# Patient Record
Sex: Female | Born: 1990 | Race: White | Hispanic: No | Marital: Single | Smoking: Never smoker
Health system: Southern US, Community
[De-identification: ages and names within clinical notes are randomized; demographics above are authoritative.]

## PROBLEM LIST (undated history)

## (undated) HISTORY — PX: WRIST SURGERY: SHX841

---

## 2021-01-06 ENCOUNTER — Other Ambulatory Visit: Payer: Self-pay

## 2021-01-06 ENCOUNTER — Emergency Department (HOSPITAL_COMMUNITY): Payer: Medicaid Other

## 2021-01-06 ENCOUNTER — Encounter (HOSPITAL_COMMUNITY): Payer: Self-pay | Admitting: Emergency Medicine

## 2021-01-06 ENCOUNTER — Emergency Department (HOSPITAL_COMMUNITY)
Admission: EM | Admit: 2021-01-06 | Discharge: 2021-01-06 | Disposition: A | Discharge status: Home / Self Care | Payer: Medicaid Other | Attending: Emergency Medicine | Admitting: Emergency Medicine

## 2021-01-06 DIAGNOSIS — R0989 Other specified symptoms and signs involving the circulatory and respiratory systems: Secondary | ICD-10-CM | POA: Diagnosis present

## 2021-01-06 DIAGNOSIS — S0990XA Unspecified injury of head, initial encounter: Secondary | ICD-10-CM | POA: Diagnosis not present

## 2021-01-06 DIAGNOSIS — R599 Enlarged lymph nodes, unspecified: Secondary | ICD-10-CM | POA: Diagnosis not present

## 2021-01-06 DIAGNOSIS — J029 Acute pharyngitis, unspecified: Secondary | ICD-10-CM | POA: Diagnosis not present

## 2021-01-06 LAB — CBC WITH DIFFERENTIAL/PLATELET
Abs Immature Granulocytes: 0.04 10*3/uL (ref 0.00–0.07)
Basophils Absolute: 0 10*3/uL (ref 0.0–0.1)
Basophils Relative: 0 %
Eosinophils Absolute: 0.1 10*3/uL (ref 0.0–0.5)
Eosinophils Relative: 1 %
HCT: 37.8 % (ref 36.0–46.0)
Hemoglobin: 12.5 g/dL (ref 12.0–15.0)
Immature Granulocytes: 0 %
Lymphocytes Relative: 16 %
Lymphs Abs: 1.8 10*3/uL (ref 0.7–4.0)
MCH: 26.8 pg (ref 26.0–34.0)
MCHC: 33.1 g/dL (ref 30.0–36.0)
MCV: 80.9 fL (ref 80.0–100.0)
Monocytes Absolute: 0.7 10*3/uL (ref 0.1–1.0)
Monocytes Relative: 7 %
Neutro Abs: 8.4 10*3/uL — ABNORMAL HIGH (ref 1.7–7.7)
Neutrophils Relative %: 76 %
Platelets: 231 10*3/uL (ref 150–400)
RBC: 4.67 MIL/uL (ref 3.87–5.11)
RDW: 13 % (ref 11.5–15.5)
WBC: 11.1 10*3/uL — ABNORMAL HIGH (ref 4.0–10.5)
nRBC: 0 % (ref 0.0–0.2)

## 2021-01-06 LAB — I-STAT CHEM 8, ED
BUN: 5 mg/dL — ABNORMAL LOW (ref 6–20)
Calcium, Ion: 1.19 mmol/L (ref 1.15–1.40)
Chloride: 102 mmol/L (ref 98–111)
Creatinine, Ser: 0.7 mg/dL (ref 0.44–1.00)
Glucose, Bld: 108 mg/dL — ABNORMAL HIGH (ref 70–99)
HCT: 37 % (ref 36.0–46.0)
Hemoglobin: 12.6 g/dL (ref 12.0–15.0)
Potassium: 3.2 mmol/L — ABNORMAL LOW (ref 3.5–5.1)
Sodium: 141 mmol/L (ref 135–145)
TCO2: 26 mmol/L (ref 22–32)

## 2021-01-06 LAB — I-STAT BETA HCG BLOOD, ED (MC, WL, AP ONLY): I-stat hCG, quantitative: <5 m[IU]/mL (ref <5)

## 2021-01-06 MED ORDER — IOHEXOL 350 MG/ML SOLN
75.0000 mL | Freq: Once | INTRAVENOUS | Status: AC | PRN
  Administered 2021-01-06: 75 mL via INTRAVENOUS

## 2021-01-06 MED ORDER — LIDOCAINE VISCOUS HCL 2 % MT SOLN: 15.0000 mL | OROMUCOSAL | 0 refills | Status: AC | PRN

## 2021-01-06 MED ORDER — CLINDAMYCIN HCL 150 MG PO CAPS: 300.0000 mg | ORAL_CAPSULE | Freq: Three times a day (TID) | ORAL | 0 refills | Status: AC

## 2021-01-06 MED ORDER — LIDOCAINE VISCOUS HCL 2 % MT SOLN
15.0000 mL | Freq: Once | OROMUCOSAL | Status: AC
  Administered 2021-01-06: 15 mL via OROMUCOSAL
  Filled 2021-01-06: qty 15

## 2021-01-06 MED ORDER — CLINDAMYCIN HCL 150 MG PO CAPS
300.0000 mg | ORAL_CAPSULE | Freq: Once | ORAL | Status: AC
  Administered 2021-01-06: 300 mg via ORAL
  Filled 2021-01-06: qty 2
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
## 2021-01-06 NOTE — Discharge Instructions (Signed)
As discussed, today's evaluation has been generally reassuring.  However, with some mild lab abnormalities, it is important that you take your medication as prescribed and follow-up with your physician for additional evaluation as needed.  Do not hesitate to return here for concerning changes.
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
## 2023-02-26 ENCOUNTER — Encounter: Payer: Medicaid Other | Admitting: Obstetrics

## 2023-04-09 ENCOUNTER — Encounter: Payer: Medicaid Other | Admitting: Student
